# Patient Record
Sex: Male | Born: 1985 | Race: Black or African American | Hispanic: No | Marital: Single | Smoking: Current every day smoker
Health system: Southern US, Community
[De-identification: ages and names within clinical notes are randomized; demographics above are authoritative.]

---
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
## 2014-08-20 ENCOUNTER — Emergency Department (HOSPITAL_COMMUNITY)
Admission: EM | Admit: 2014-08-20 | Discharge: 2014-08-20 | Disposition: A | Discharge status: Home / Self Care | Payer: Self-pay | Attending: Emergency Medicine | Admitting: Emergency Medicine

## 2014-08-20 ENCOUNTER — Emergency Department (HOSPITAL_COMMUNITY): Payer: Self-pay

## 2014-08-20 ENCOUNTER — Encounter (HOSPITAL_COMMUNITY): Payer: Self-pay | Admitting: Emergency Medicine

## 2014-08-20 DIAGNOSIS — S022XXA Fracture of nasal bones, initial encounter for closed fracture: Secondary | ICD-10-CM | POA: Insufficient documentation

## 2014-08-20 DIAGNOSIS — Y998 Other external cause status: Secondary | ICD-10-CM | POA: Insufficient documentation

## 2014-08-20 DIAGNOSIS — Y9289 Other specified places as the place of occurrence of the external cause: Secondary | ICD-10-CM | POA: Insufficient documentation

## 2014-08-20 DIAGNOSIS — S0083XA Contusion of other part of head, initial encounter: Secondary | ICD-10-CM | POA: Insufficient documentation

## 2014-08-20 DIAGNOSIS — S0012XA Contusion of left eyelid and periocular area, initial encounter: Secondary | ICD-10-CM | POA: Insufficient documentation

## 2014-08-20 DIAGNOSIS — H1132 Conjunctival hemorrhage, left eye: Secondary | ICD-10-CM | POA: Insufficient documentation

## 2014-08-20 DIAGNOSIS — Z720 Tobacco use: Secondary | ICD-10-CM | POA: Insufficient documentation

## 2014-08-20 DIAGNOSIS — Y9389 Activity, other specified: Secondary | ICD-10-CM | POA: Insufficient documentation

## 2014-08-20 MED ORDER — OXYMETAZOLINE HCL 0.05 % NA SOLN
2.0000 | Freq: Two times a day (BID) | NASAL | Status: DC | PRN
Start: 2014-08-20 — End: 2014-08-20
  Administered 2014-08-20: 2 via NASAL
  Filled 2014-08-20: qty 15
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
## 2014-08-20 NOTE — ED Notes (Signed)
Patient came to nurse's station prior to discharge with nose bleeding. Dr Lynelle DoctorKnapp made aware-no verbal orders given.
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
## 2014-08-20 NOTE — ED Notes (Signed)
Pt made aware to return if symptoms worsen or if any life threatening symptoms occur.   

## 2014-08-20 NOTE — Discharge Instructions (Signed)
Use ice packs on the swollen area. You can take acetaminophen if needed for pain. You should have your left eye rechecked by an ophthalmologist. [place] reche[place] eye. You do have a broken nose. You can have it rechecked by the ears nose and throat specialist, Dr Suszanne Connerseoh. Call his office to get an appointment.  Return to the ED if you have loss of vision in your left eye or any problems listed on the head injury sheet.    Head Injury You have a head injury. Headaches and throwing up (vomiting) are common after a head injury. It should be easy to wake up from sleeping. Sometimes you must stay in the hospital. Most problems happen within the first 24 hours. Side effects may occur up to 7-10 days after the injury.  WHAT ARE THE TYPES OF HEAD INJURIES? Head injuries can be as minor as a bump. Some head injuries can be more severe. More severe head injuries include:  A jarring injury to the brain (concussion).  A bruise of the brain (contusion). This mean there is bleeding in the brain that can cause swelling.  A cracked skull (skull fracture).  Bleeding in the brain that collects, clots, and forms a bump (hematoma). WHEN SHOULD I GET HELP RIGHT AWAY?   You are confused or sleepy.  You cannot be woken up.  You feel sick to your stomach (nauseous) or keep throwing up (vomiting).  Your dizziness or unsteadiness is getting worse.  You have very bad, lasting headaches that are not helped by medicine. Take medicines only as told by your doctor.  You cannot use your arms or legs like normal.  You cannot walk.  You notice changes in the black spots in the center of the colored part of your eye (pupil).  You have clear or bloody fluid coming from your nose or ears.  You have trouble seeing. During the next 24 hours after the injury, you must stay with someone who can watch you. This person should get help right away (call 911 in the [place] if you start to  shake and are not able to control it (have seizures), you pass out, or you are unable to wake up. HOW CAN I PREVENT A HEAD INJURY IN THE FUTURE?  Wear seat belts.  Wear a helmet while bike riding and playing sports like football.  Stay away from dangerous activities around the house. WHEN CAN I RETURN TO NORMAL ACTIVITIES AND ATHLETICS? See your doctor before doing these activities. You should not do normal activities or play contact sports until 1 week after the following symptoms have stopped:  Headache that does not go away.  Dizziness.  Poor attention.  Confusion.  Memory problems.  Sickness to your stomach or throwing up.  Tiredness.  Fussiness.  Bothered by bright lights or loud noises.  Anxiousness or depression.  Restless sleep. MAKE SURE YOU:   Understand these instructions.  Will watch your condition.  Will get help right away if you are not doing well or get worse. Document Released: 03/06/2008 Document Revised: 08/08/2013 Document Reviewed: 11/29/2012 [facility] Patient Information 2015 [place], [place]. This information is not intended to replace advice given to you by your health care provider. Make sure you discuss any questions you have with your health care provider.  Nasal Fracture A fracture is a break in the bone. A nasal fracture is a broken nose. Minor breaks do not need treatment. Serious breaks may  need surgery.  HOME CARE  Put ice on the injured area.  Put ice in a plastic bag.  Place a towel between your skin and the bag.  Leave the ice on for 15-20 minutes, 03-04 times a day.  Only take medicine as told by your doctor.  If your nose bleeds, squeeze your nose shut gently. Sit upright for 10 minutes.  Do not play contact sports for 3 to 4 weeks or as told by your doctor. GET HELP RIGHT AWAY IF:   You have more pain or severe pain.  You keep having nosebleeds.  The shape of your nose does not return to normal after 5 days.  You  have yellowish white fluid (pus) coming from your nose.  Your nose bleeds for over 20 minutes.  Clear fluid drains from your nose.  You have a grape-like puffiness (swelling) on the inside of your nose.  You have trouble moving your eyes.  You keep throwing up (vomiting). MAKE SURE YOU:   Understand these instructions.  Will watch this condition.  Will get help right away if you are not doing well or get worse. Document Released: 01/01/2008 Document Revised: 06/16/2011 Document Reviewed: 07/08/2010 [facility] Patient Information 2015 [place], [place]. This information is not intended to replace advice given to you by your health care provider. Make sure you discuss any questions you have with your health care provider.  Subconjunctival Hemorrhage Your exam shows you have a subconjunctival hemorrhage. This is a harmless collection of blood covering a portion of the white of the eye. This condition may be due to injury or to straining (lifting, sneezing, or coughing). Often, there is no known cause. Subconjunctival blood does not cause pain or vision problems. This condition needs no treatment. It will take 1 to 2 weeks for the blood to dissolve. If you take aspirin or Coumadin on a daily basis or if you have high blood pressure, you should check with your doctor about the need for further treatment. Please call your doctor if you have problems with your vision, pain around the eye, or any other concerns about your condition. Document Released: 05/01/2004 Document Revised: 06/16/2011 Document Reviewed: 02/19/2009 [facility] Patient Information 2015 [place], [place]. This information is not intended to replace advice given to you by your health care provider. Make sure you discuss any questions you have with your health care provider.

## 2019-06-28 ENCOUNTER — Other Ambulatory Visit: Payer: Self-pay

## 2019-06-28 ENCOUNTER — Emergency Department (HOSPITAL_COMMUNITY): Admission: EM | Admit: 2019-06-28 | Discharge: 2019-06-28 | Discharge status: Absent without leave | Payer: Self-pay

## 2021-08-12 ENCOUNTER — Emergency Department (HOSPITAL_BASED_OUTPATIENT_CLINIC_OR_DEPARTMENT_OTHER): Payer: Self-pay

## 2021-08-12 ENCOUNTER — Emergency Department (HOSPITAL_BASED_OUTPATIENT_CLINIC_OR_DEPARTMENT_OTHER)
Admission: EM | Admit: 2021-08-12 | Discharge: 2021-08-12 | Disposition: A | Discharge status: Home / Self Care | Payer: Self-pay | Attending: Emergency Medicine | Admitting: Emergency Medicine

## 2021-08-12 ENCOUNTER — Other Ambulatory Visit: Payer: Self-pay

## 2021-08-12 ENCOUNTER — Encounter (HOSPITAL_BASED_OUTPATIENT_CLINIC_OR_DEPARTMENT_OTHER): Payer: Self-pay | Admitting: Obstetrics and Gynecology

## 2021-08-12 DIAGNOSIS — R9431 Abnormal electrocardiogram [ECG] [EKG]: Secondary | ICD-10-CM | POA: Insufficient documentation

## 2021-08-12 DIAGNOSIS — R55 Syncope and collapse: Secondary | ICD-10-CM | POA: Insufficient documentation

## 2021-08-12 DIAGNOSIS — R519 Headache, unspecified: Secondary | ICD-10-CM | POA: Insufficient documentation

## 2021-08-12 DIAGNOSIS — R42 Dizziness and giddiness: Secondary | ICD-10-CM | POA: Insufficient documentation

## 2021-08-12 LAB — COMPREHENSIVE METABOLIC PANEL
ALT: 13 U/L (ref 0–44)
AST: 21 U/L (ref 15–41)
Albumin: 4.2 g/dL (ref 3.5–5.0)
Alkaline Phosphatase: 41 U/L (ref 38–126)
Anion gap: 10 (ref 5–15)
BUN: 11 mg/dL (ref 6–20)
CO2: 26 mmol/L (ref 22–32)
Calcium: 9.1 mg/dL (ref 8.9–10.3)
Chloride: 102 mmol/L (ref 98–111)
Creatinine, Ser: 1.05 mg/dL (ref 0.61–1.24)
GFR, Estimated: >60 mL/min (ref >60)
Glucose, Bld: 100 mg/dL — ABNORMAL HIGH (ref 70–99)
Potassium: 3.6 mmol/L (ref 3.5–5.1)
Sodium: 138 mmol/L (ref 135–145)
Total Bilirubin: 0.9 mg/dL (ref 0.3–1.2)
Total Protein: 6.4 g/dL — ABNORMAL LOW (ref 6.5–8.1)

## 2021-08-12 LAB — CBC
HCT: 41.1 % (ref 39.0–52.0)
Hemoglobin: 14.2 g/dL (ref 13.0–17.0)
MCH: 31.5 pg (ref 26.0–34.0)
MCHC: 34.5 g/dL (ref 30.0–36.0)
MCV: 91.1 fL (ref 80.0–100.0)
Platelets: 228 10*3/uL (ref 150–400)
RBC: 4.51 MIL/uL (ref 4.22–5.81)
RDW: 13.1 % (ref 11.5–15.5)
WBC: 5 10*3/uL (ref 4.0–10.5)
nRBC: 0 % (ref 0.0–0.2)

## 2021-08-12 LAB — TROPONIN I (HIGH SENSITIVITY): Troponin I (High Sensitivity): <2 ng/L (ref <18)

## 2021-08-12 LAB — CBG MONITORING, ED: Glucose-Capillary: 114 mg/dL — ABNORMAL HIGH (ref 70–99)

## 2021-08-12 LAB — D-DIMER, QUANTITATIVE: D-Dimer, Quant: 0.47 ug/mL-FEU (ref 0.00–0.50)

## 2021-08-12 MED ORDER — SODIUM CHLORIDE 0.9 % IV BOLUS
1000.0000 mL | Freq: Once | INTRAVENOUS | Status: AC
  Administered 2021-08-12: 1000 mL via INTRAVENOUS

## 2021-08-12 NOTE — ED Notes (Signed)
Denies dizziness or feeling light headed when ambulating // MD made aware  ?

## 2021-08-12 NOTE — ED Provider Notes (Addendum)
?[facility] EM[facility] DEPT ?Provider Note ? ? ?CSN: [id number] ?Arrival date & time: 08/12/21  1628 ? ?  ? ?History ? ?Chief Complaint  ?Patient presents with  ? Loss of Consciousness  ? ? ?Mason Herring is a 36 y.o. male. ? ?At homePatient presents ER chief complaint of lightheadedness and loss of consciousness.  He states that he was fine, walking around would he felt lightheaded and dizzy and fell to his knees.  Next he knew he was waking up from the ground.  After waking up about 10 minutes later he passed out again per wife.  She states that she heard a thud and came over and found him on the ground.  She was unresponsive for about 1 or 2 minutes before waking up.  Currently complaining of headache on his right forehead region where he reportedly hit the doorway.  Otherwise denies any neck pain or back pain.  No reported chest pain.  No reported preceding headache.  No nausea vomiting cough or diarrhea.  Patient states he drinks daily and states he had about 5 or 6 beers today. ? ? ?  ? ?Home Medications ?Prior to Admission medications   ?Not on File  ?   ? ?Allergies    ?Patient has no known allergies.   ? ?Review of Systems   ?Review of Systems  ?Constitutional:  Negative for fever.  ?HENT:  Negative for ear pain and sore throat.   ?Eyes:  Negative for pain.  ?Respiratory:  Negative for cough.   ?Cardiovascular:  Negative for chest pain.  ?Gastrointestinal:  Negative for abdominal pain.  ?Genitourinary:  Negative for flank pain.  ?Musculoskeletal:  Negative for back pain.  ?Skin:  Negative for color change and rash.  ?All other systems reviewed and are negative. ? ?Physical Exam ?Updated Vital Signs ?BP 103/70   Pulse 69   Temp 98.4 ?F (36.9 ?C) (Oral)   Resp (!) 23   Ht 5\' 7"  (1.702 m)   Wt 72.6 kg   SpO2 100%   BMI 25.06 kg/m?  ?Physical Exam ?Constitutional:   ?   Appearance: He is well-developed.  ?HENT:  ?   Head: Normocephalic.  ?   Nose: Nose normal.  ?Eyes:  ?   Extraocular  Movements: Extraocular movements intact.  ?Cardiovascular:  ?   Rate and Rhythm: Normal rate.  ?Pulmonary:  ?   Effort: Pulmonary effort is normal.  ?Musculoskeletal:     ?   General: Normal range of motion.  ?Skin: ?   Coloration: Skin is not jaundiced.  ?Neurological:  ?   General: No focal deficit present.  ?   Mental Status: He is alert and oriented to person, place, and time. Mental status is at baseline.  ?   Cranial Nerves: No cranial nerve deficit.  ?   Motor: No weakness.  ? ? ?ED Results / Procedures / Treatments   ?Labs ?(all labs ordered are listed, but only abnormal results are displayed) ?Labs Reviewed  ?COMPREHENSIVE METABOLIC PANEL - Abnormal; Notable for the following components:  ?    Result Value  ? Glucose, Bld 100 (*)   ? Total Protein 6.4 (*)   ? All other components within normal limits  ?CBG MONITORING, ED - Abnormal; Notable for the following components:  ? Glucose-Capillary 114 (*)   ? All other components within normal limits  ?CBC  ?D-DIMER, QUANTITATIVE  ?TROPONIN I (HIGH SENSITIVITY)  ? ? ?EKG ?EKG Interpretation ? ?Date/Time:  Monday Aug 12 2021 16:41:27 EDT ?Ventricular Rate:  79 ?PR Interval:  232 ?QRS Duration: 87 ?QT Interval:  362 ?QTC Calculation: 415 ?R Axis:   77 ?Text Interpretation: Sinus rhythm Prolonged PR interval LVH by voltage Inferior infarct, acute (LCx) Lateral leads are also involved Confirmed by Norman ClayHong, Velton Roselle ([id number]) on 08/12/2021 5:17:05 PM ? ?Radiology ?CT Head Wo Contrast ? ?Result Date: 08/12/2021 ?CLINICAL DATA:  Trauma EXAM: CT HEAD WITHOUT CONTRAST TECHNIQUE: Contiguous axial images were obtained from the base of the skull through the vertex without intravenous contrast. RADIATION DOSE REDUCTION: This exam was performed according to the departmental dose-optimization program which includes automated exposure control, adjustment of the mA and/or kV according to patient size and/or use of iterative reconstruction technique. COMPARISON:  08/20/2014 FINDINGS: Brain: No  acute intracranial findings are seen in noncontrast CT brain. Ventricles are not dilated. There is no shift of midline structures. There are no epidural or subdural fluid collections. Vascular: Unremarkable. Skull: Unremarkable. Sinuses/Orbits: There is mucosal thickening in the ethmoid and sphenoid sinuses. Other: None IMPRESSION: No acute intracranial findings are seen in the noncontrast CT brain. Chronic sinusitis. Electronically Signed   By: Ernie AvenaPalani  Rathinasamy M.D.   On: 08/12/2021 17:48  ? ?DG Chest Port 1 View ? ?Result Date: 08/12/2021 ?CLINICAL DATA:  Syncope, fall EXAM: PORTABLE CHEST 1 VIEW COMPARISON:  None Available. FINDINGS: The heart size and mediastinal contours are within normal limits. Both lungs are clear. The visualized skeletal structures are unremarkable. IMPRESSION: No active disease. Electronically Signed   By: Ernie AvenaPalani  Rathinasamy M.D.   On: 08/12/2021 17:17   ? ?Procedures ?Procedures  ? ? ?Medications Ordered in ED ?Medications  ?sodium chloride 0.9 % bolus 1,000 mL (0 mLs Intravenous Stopped 08/12/21 1932)  ? ? ?ED Course/ Medical Decision Making/ A&P ?  ?                        ?Medical Decision Making ?Amount and/or Complexity of Data Reviewed ?Labs: ordered. ?Radiology: ordered. ? ? ?History obtained from patient and his wife at bedside. ? ?Cardiac monitoring shows sinus rhythm. ? ?Chart review shows prior ER visit back 2 years ago. ? ?Labs otherwise sent and chemistry appears unremarkable.  White count 5 hemoglobin 14 glucose levels are normal as well. ? ?CT imaging of the head and chest is unremarkable per radiologist no acute findings noted. ? ?EKG concerning for subtle ST elevations diffusely with questionable PR depressions.  Pericarditis considered, however the patient states he has no preceding chest pain and no current chest pain or chest discomfort. ? ?Troponin was sent and this is negative as well.  D-dimer added on with normal D-dimer levels. ? ?Patient continues to have no  symptoms at this time other than a mild headache.  Given a liter bolus of fluids improvement of symptoms.  Ambulatory here without assistance. ? ?Recommending outpatient follow-up with cardiology within the week.  Recommending immediate return for worsening symptoms otherwise recommending increase fluid intake and avoidance of strenuous activity for the next 2 to 3 days. ? ? ? ?Final Clinical Impression(s) / ED Diagnoses ?Final diagnoses:  ?Syncope, unspecified syncope type  ?Abnormal EKG  ? ? ?Rx / DC Orders ?ED Discharge Orders   ? ?      Ordered  ?  Ambulatory referral to Cardiology       ?Comments: Syncope, abnormal EKG  ? 08/12/21 1935  ? ?  ?  ? ?  ? ? ?  ?Cheryll CockayneHong, Alamin Mccuiston S, MD ?  08/12/21 1927 ? ?  ?Cheryll Cockayne, MD ?08/12/21 1935 ? ?

## 2021-08-12 NOTE — Discharge Instructions (Signed)
Drink plenty of fluids at home, decrease your alcohol intake, avoid strenuous activity for the next few days. ? ?Return back to the ER if you have worsening symptoms difficulty breathing fevers chest pain or any additional concerns. ?
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
## 2021-08-28 ENCOUNTER — Ambulatory Visit (INDEPENDENT_AMBULATORY_CARE_PROVIDER_SITE_OTHER): Payer: Self-pay | Admitting: Cardiology

## 2021-08-28 ENCOUNTER — Encounter (HOSPITAL_BASED_OUTPATIENT_CLINIC_OR_DEPARTMENT_OTHER): Payer: Self-pay | Admitting: Cardiology

## 2021-08-28 VITALS — BP 100/60 | HR 76 | Ht 66.5 in | Wt 154.0 lb

## 2021-08-28 DIAGNOSIS — R55 Syncope and collapse: Secondary | ICD-10-CM

## 2021-08-28 DIAGNOSIS — Z7189 Other specified counseling: Secondary | ICD-10-CM

## 2021-08-28 DIAGNOSIS — R9431 Abnormal electrocardiogram [ECG] [EKG]: Secondary | ICD-10-CM

## 2021-08-28 NOTE — Patient Instructions (Signed)

## 2021-08-28 NOTE — Progress Notes (Signed)
Cardiology Office Note:    Date:  08/28/2021   ID:  Mason Herring., DOB 01/24/1986, MRN [id number]  PCP:  Patient, No Pcp Per (Inactive)  Cardiologist:  Buford Dresser, MD  Referring MD: Luna Fuse, MD   CC: new patient evaluation for syncope  History of Present Illness:    Mason Herring. is a 36 y.o. male without prior cardiac history who is seen as a new consult at the request of [name], Greggory Brandy, MD for the evaluation and management of syncope.  ER note from Dr. Almyra Free dated 08/12/21 reviewed. Symptoms improved with 1L IV fluid. Referred to cardiology for further evaluation.  Syncope: Initial episode: 08/12/21 was first episode ever. Was sitting at the table talking, felt hot. Stood up to walk to the living room, fell to his knees. Second time he fell, passed our for about a minute, hit his head on the door.  Frequency: just those two times Duration of episodes: about a minute Presyncopal symptoms: warmth/lightheadedness Post syncope symptoms: no confusion, no incontinence Aggravating/alleviating factors: no recent illness. Good oral intake.  Pre-existing medical conditions: none Potential medication/supplement interactions: had been drinking that day, unclear how much Prior workup: d dimer normal, hsTn normal, otherwise workup unremarkable ECG: 08/12/21 SR with 1st degree AV block. Read as acute inferolateral infarct but J point elevation normal for age Family history: none Tobacco: currently Alcohol: 3-4 beers a day.   History reviewed. No pertinent past medical history.  History reviewed. No pertinent surgical history.  Current Medications: No current outpatient medications on file prior to visit.   No current facility-administered medications on file prior to visit.     Allergies:   Patient has no known allergies.   Social History   Tobacco Use   Smoking status: Every Day    Types: Cigars    Passive exposure: Current  Vaping Use   Vaping Use:  Never used  Substance Use Topics   Alcohol use: Yes    Comment: More than 10   Drug use: Yes    Types: Marijuana    Family History: family history includes Diabetes in his father; Hypertension in his father; Multiple sclerosis in his mother.  ROS:   Please see the history of present illness.  Additional pertinent ROS: Constitutional: Negative for chills, fever, night sweats, unintentional weight loss  HENT: Negative for ear pain and hearing loss.   Eyes: Negative for loss of vision and eye pain.  Respiratory: Negative for cough, sputum, wheezing.   Cardiovascular: See HPI. Gastrointestinal: Negative for abdominal pain, melena, and hematochezia.  Genitourinary: Negative for dysuria and hematuria.  Musculoskeletal: Negative for falls and myalgias.  Skin: Negative for itching and rash.  Neurological: Negative for focal weakness, focal sensory changes and loss of consciousness.  Endo/Heme/Allergies: Does not bruise/bleed easily.     EKGs/Labs/Other Studies Reviewed:    The following studies were reviewed today: No prior cardiac studies  EKG:  EKG is personally reviewed.   08/28/21: NSR at 76 bpm, J point elevation/early repol normal for age  Recent Labs: 08/12/2021: ALT 13; BUN 11; Creatinine, Ser 1.05; Hemoglobin 14.2; Platelets 228; Potassium 3.6; Sodium 138  Recent Lipid Panel No results found for: CHOL, TRIG, HDL, CHOLHDL, VLDL, LDLCALC, LDLDIRECT  Physical Exam:    VS:  BP 100/60   Pulse 76   Ht 5' 6.5" (1.689 m)   Wt 154 lb (69.9 kg)   SpO2 95%   BMI 24.48 kg/m     Orthostatic  VS for the past 24 hrs (Last 3 readings):  BP- Lying Pulse- Lying BP- Sitting Pulse- Sitting BP- Standing at 0 minutes Pulse- Standing at 0 minutes BP- Standing at 3 minutes Pulse- Standing at 3 minutes  08/28/21 1545 108/60 66 103/66 69 107/69 66 103/66 79     Wt Readings from Last 3 Encounters:  08/28/21 154 lb (69.9 kg)  08/12/21 160 lb (72.6 kg)  08/20/14 160 lb (72.6 kg)    GEN:  Well nourished, well developed in no acute distress HEENT: Normal, moist mucous membranes NECK: No JVD CARDIAC: regular rhythm, normal S1 and S2, no rubs or gallops. No murmur. VASCULAR: Radial and DP pulses 2+ bilaterally. No carotid bruits RESPIRATORY:  Clear to auscultation without rales, wheezing or rhonchi  ABDOMEN: Soft, non-tender, non-distended MUSCULOSKELETAL:  Ambulates independently SKIN: Warm and dry, no edema NEUROLOGIC:  Alert and oriented x 3. No focal neuro deficits noted. PSYCHIATRIC:  Normal affect    ASSESSMENT:    1. Syncope and collapse   2. Abnormal ECG   3. Cardiac risk counseling   4. Counseling on health promotion and disease prevention    PLAN:    Syncope I follow the ACC/AHA/HRS guideline recommendations for the evaluation of syncope.  These guidelines recommend history, physical and ECG on initial evaluation. Based on this evaluation, suspected cause, and risk assessment (see below), if there is suspicion for a cardiac cause of symptoms, then further cardiac testing such as monitor, echocardiogram, or other testing.  Statistically, the most common cause of syncope is vasovagal or reflex syncope. There is no increase in mortality with this type of syncope, and management is conservative. We focus on compression stockings, hydration, salt intake, cardiovascular exercise, and awareness/management of prodrome (ex: lying on the ground and elevating legs on a chair).  Concern for cardiac cause of syncope: age >27, male gender, known cardiovascular disease, no prodrome, syncope with exertion or while supine, abnormal exam, family history of premature sudden cardiac death  Likely noncardiac causes of syncope: younger age, no known cardiac disease, syncope while upright or with positional changes, presence of prodrome, presence of specific triggers/situational triggers, frequent recurrence/prolonged history of syncope   -we discussed the multiple possible etiologies  of syncope, including both cardiac and noncardiac causes -we discussed high risk features to watch for; none noted on interview today -we discussed guideline recommended evaluation. ECG today unremarkable  Abnormal ECG: normal for age, J point elevation/early repol  Cardiac risk counseling and prevention recommendations: -recommend heart healthy/Mediterranean diet, with whole grains, fruits, vegetable, fish, lean meats, nuts, and olive oil. Limit salt. -recommend moderate walking, 3-5 times/week for 30-50 minutes each session. Aim for at least 150 minutes.week. Goal should be pace of 3 miles/hours, or walking 1.5 miles in 30 minutes -recommend avoidance of tobacco products. Avoid excess alcohol. -ASCVD risk score: The ASCVD Risk score (Arnett DK, et al., 2019) failed to calculate for the following reasons:   The 2019 ASCVD risk score is only valid for ages 23 to 38    Plan for follow up: as needed  Jodelle Red, MD, PhD, [facility] [place]  [facility]    Medication Adjustments/Labs and Tests Ordered: Current medicines are reviewed at length with the patient today.  Concerns regarding medicines are outlined above.  Orders Placed This Encounter  Procedures   EKG 12-Lead   No orders of the defined types were placed in this encounter.   Patient Instructions  Medication Instructions:  Your Physician recommend you continue on  your current medication as directed.    *If you need a refill on your cardiac medications before your next appointment, please call your pharmacy*   Lab Work: None ordered today   Testing/Procedures: None ordered today   Follow-Up: At [facility], you and your health needs are our priority.  As part of our continuing mission to provide you with exceptional heart care, we have created designated Provider Care Teams.  These Care Teams include your primary Cardiologist (physician) and Advanced Practice Providers (APPs -  Physician Assistants and  Nurse Practitioners) who all work together to provide you with the care you need, when you need it.  We recommend signing up for the patient portal called "MyChart".  Sign up information is provided on this After Visit Summary.  MyChart is used to connect with patients for Virtual Visits (Telemedicine).  Patients are able to view lab/test results, encounter notes, upcoming appointments, etc.  Non-urgent messages can be sent to your provider as well.   To learn more about what you can do with MyChart, go to [email or web address].    Your next appointment:   As needed  The format for your next appointment:   In Person  Provider:   Buford Dresser, MD           Signed, Buford Dresser, MD PhD 08/28/2021 5:19 PM    [place]

## 2021-10-04 ENCOUNTER — Emergency Department (HOSPITAL_BASED_OUTPATIENT_CLINIC_OR_DEPARTMENT_OTHER)
Admission: EM | Admit: 2021-10-04 | Discharge: 2021-10-04 | Disposition: A | Discharge status: Home / Self Care | Payer: Self-pay | Attending: Emergency Medicine | Admitting: Emergency Medicine

## 2021-10-04 ENCOUNTER — Other Ambulatory Visit: Payer: Self-pay

## 2021-10-04 DIAGNOSIS — R102 Pelvic and perineal pain: Secondary | ICD-10-CM | POA: Insufficient documentation

## 2021-10-04 DIAGNOSIS — F17290 Nicotine dependence, other tobacco product, uncomplicated: Secondary | ICD-10-CM | POA: Insufficient documentation

## 2021-10-04 DIAGNOSIS — Z202 Contact with and (suspected) exposure to infections with a predominantly sexual mode of transmission: Secondary | ICD-10-CM | POA: Insufficient documentation

## 2021-10-04 MED ORDER — METRONIDAZOLE 500 MG PO TABS
2000.0000 mg | ORAL_TABLET | Freq: Once | ORAL | Status: AC
  Administered 2021-10-04: 2000 mg via ORAL
  Filled 2021-10-04: qty 4

## 2021-10-04 NOTE — ED Provider Notes (Signed)
   [facility] Provider Note: Lowella Dell, MD, FACEP  CSN: [id number] MRN: [id number] ARRIVAL: 10/04/21 at 0218 ROOM: DB014/DB014   CHIEF COMPLAINT  Exposure to STD   HISTORY OF PRESENT ILLNESS  10/04/21 2:42 AM Mason Herring Mason Herring. is a 36 y.o. male who is here with his significant other.  She was seen for pelvic pain and wet prep showed trichomoniasis.  The patient himself has no symptoms.  Specifically he has no dysuria, urethral discharge or abdominal pain.  Since he is the other patients sexual partner he was advised to sign in to be treated for trichomoniasis as well.   No past medical history on file.  No past surgical history on file.  Family History  Problem Relation Age of Onset   Multiple sclerosis Mother    Diabetes Father    Hypertension Father     Social History   Tobacco Use   Smoking status: Every Day    Types: Cigars    Passive exposure: Current  Vaping Use   Vaping Use: Never used  Substance Use Topics   Alcohol use: Yes    Comment: More than 10   Drug use: Yes    Types: Marijuana    Prior to Admission medications   Not on File    Allergies Patient has no known allergies.   REVIEW OF SYSTEMS  Negative except as noted here or in the History of Present Illness.   PHYSICAL EXAMINATION  Initial Vital Signs Blood pressure 124/78, pulse 74, temperature 98.1 F (36.7 C), temperature source Oral, resp. rate 19, SpO2 95 %.  Examination General: Well-developed, well-nourished male in no acute distress; appearance consistent with age of record HENT: normocephalic; atraumatic Eyes: Normal appearance Neck: supple Heart: regular rate and rhythm Lungs: clear to auscultation bilaterally Abdomen: soft; nondistended; nontender; bowel sounds present Extremities: No deformity; full range of motion Neurologic: Awake, alert and oriented; motor function intact in all extremities and symmetric; no facial droop Skin: Warm and dry Psychiatric:  Normal mood and affect   RESULTS  Summary of this visit's results, reviewed and interpreted by myself:   EKG Interpretation  Date/Time:    Ventricular Rate:    PR Interval:    QRS Duration:   QT Interval:    QTC Calculation:   R Axis:     Text Interpretation:         Laboratory Studies: No results found for this or any previous visit (from the past 24 hour(s)). Imaging Studies: No results found.  ED COURSE and MDM  Nursing notes, initial and subsequent vitals signs, including pulse oximetry, reviewed and interpreted by myself.  Vitals:   10/04/21 0240 10/04/21 0242  BP: 124/78   Pulse: 74   Resp: 19   Temp:  98.1 F (36.7 C)  TempSrc:  Oral  SpO2: 95%    Medications  metroNIDAZOLE (FLAGYL) tablet 2,000 mg (has no administration in time range)   Patient treated for trichomoniasis with 2 g of Flagyl.  Gonorrhea and Chlamydia testing pending.   PROCEDURES  Procedures   ED DIAGNOSES     ICD-10-CM   1. Exposure to trichomonas  Z20.2          Mason Brislin, MD 10/04/21 [phone]

## 2021-10-04 NOTE — ED Triage Notes (Signed)
Pt here for STD TX following exposure

## 2021-10-04 NOTE — ED Notes (Signed)
Discharge instructions discussed with pt. Pt verbalized understanding with no questions at this time.  

## 2021-10-07 LAB — GC/CHLAMYDIA PROBE AMP (~~LOC~~) NOT AT ARMC
Chlamydia: NEGATIVE
Comment: NEGATIVE
Comment: NORMAL
Neisseria Gonorrhea: NEGATIVE

## 2022-03-12 ENCOUNTER — Emergency Department (HOSPITAL_COMMUNITY): Admission: EM | Admit: 2022-03-12 | Discharge: 2022-03-12 | Discharge status: Against Medical Advice | Payer: Self-pay

## 2022-03-15 DIAGNOSIS — Z4802 Encounter for removal of sutures: Secondary | ICD-10-CM | POA: Insufficient documentation

## 2022-03-16 ENCOUNTER — Emergency Department (HOSPITAL_COMMUNITY)
Admission: EM | Admit: 2022-03-16 | Discharge: 2022-03-16 | Disposition: A | Discharge status: Home / Self Care | Payer: Self-pay | Attending: Emergency Medicine | Admitting: Emergency Medicine

## 2022-03-16 ENCOUNTER — Other Ambulatory Visit: Payer: Self-pay

## 2022-03-16 ENCOUNTER — Encounter (HOSPITAL_COMMUNITY): Payer: Self-pay

## 2022-03-16 DIAGNOSIS — Z4802 Encounter for removal of sutures: Secondary | ICD-10-CM

## 2022-03-16 NOTE — ED Triage Notes (Signed)
Pt arrived via POV for staple removal. Pt reports they were placed in his right thigh and mid back on 02/25/2022.
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
## 2022-03-16 NOTE — Discharge Instructions (Signed)
Return to the ER if you experience any new and/or concerning issues. 
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
